# Patient Record
Sex: Male | Born: 1980 | Race: White | Hispanic: Yes | Marital: Single | Smoking: Current every day smoker
Health system: Southern US, Community
[De-identification: ages and names within clinical notes are randomized; demographics above are authoritative.]

---

## 2016-01-09 ENCOUNTER — Emergency Department: Payer: Self-pay

## 2016-01-09 ENCOUNTER — Emergency Department
Admission: EM | Admit: 2016-01-09 | Discharge: 2016-01-09 | Disposition: A | Discharge status: Home / Self Care | Payer: Self-pay | Attending: Emergency Medicine | Admitting: Emergency Medicine

## 2016-01-09 ENCOUNTER — Other Ambulatory Visit: Payer: Self-pay

## 2016-01-09 DIAGNOSIS — R0789 Other chest pain: Secondary | ICD-10-CM | POA: Insufficient documentation

## 2016-01-09 DIAGNOSIS — Z87891 Personal history of nicotine dependence: Secondary | ICD-10-CM | POA: Insufficient documentation

## 2016-01-09 LAB — CBC
HCT: 42.7 % (ref 40.0–52.0)
Hemoglobin: 14.5 g/dL (ref 13.0–18.0)
MCH: 29.6 pg (ref 26.0–34.0)
MCHC: 33.9 g/dL (ref 32.0–36.0)
MCV: 87.4 fL (ref 80.0–100.0)
Platelets: 231 10*3/uL (ref 150–440)
RBC: 4.89 MIL/uL (ref 4.40–5.90)
RDW: 13.6 % (ref 11.5–14.5)
WBC: 5.8 10*3/uL (ref 3.8–10.6)

## 2016-01-09 LAB — BASIC METABOLIC PANEL
Anion gap: 4 — ABNORMAL LOW (ref 5–15)
BUN: 26 mg/dL — AB (ref 6–20)
CALCIUM: 9.1 mg/dL (ref 8.9–10.3)
CO2: 27 mmol/L (ref 22–32)
CREATININE: 0.83 mg/dL (ref 0.61–1.24)
Chloride: 108 mmol/L (ref 101–111)
GFR calc Af Amer: >60 mL/min (ref >60)
GLUCOSE: 93 mg/dL (ref 65–99)
Potassium: 4 mmol/L (ref 3.5–5.1)
Sodium: 139 mmol/L (ref 135–145)

## 2016-01-09 LAB — TROPONIN I

## 2016-01-09 MED ORDER — IBUPROFEN 600 MG PO TABS: 600.0000 mg | ORAL_TABLET | Freq: Three times a day (TID) | ORAL | Status: AC | PRN

## 2016-01-09 NOTE — ED Provider Notes (Signed)
[facility] Emergency Department Provider Note    ____________________________________________  Time seen: ~1345  I have reviewed the triage vital signs and the nursing notes.   HISTORY  Chief Complaint Chest Pain   History limited by: Not Limited   HPI Tom Lawrence is a 35 y.o. male with no significant past medical history presents to the emergency department today because of concerns for left chest pain. Patient states the pain has been present for the past 2-3 weeks. He describes it as being located in his left front chest. There is also some pain in his left upper back. It is worse when he moves his arm or bends over. He denies any significant associated shortness breath. No fevers. No trauma. Denies similar symptoms in the past. Has tried Tylenol without great relief.   History reviewed. No pertinent past medical history.  There are no active problems to display for this patient.   History reviewed. No pertinent past surgical history.   Allergies Review of patient's allergies indicates no known allergies.  No family history on file.  Social History Social History  Substance Use Topics  . Smoking status: Former Games developermoker  . Smokeless tobacco: None  . Alcohol Use: Yes    Review of Systems  Constitutional: Negative for fever. Cardiovascular: Positive for left sided chest pain. Respiratory: Negative for shortness of breath. Gastrointestinal: Negative for abdominal pain, vomiting and diarrhea. Neurological: Negative for headaches, focal weakness or numbness.  10-point ROS otherwise negative.  ____________________________________________   PHYSICAL EXAM:  VITAL SIGNS: ED Triage Vitals  Enc Vitals Group     BP 01/09/16 1215 114/61 mmHg     Pulse Rate 01/09/16 1215 48     Resp 01/09/16 1215 20     Temp 01/09/16 1215 97.9 F (36.6 C)     Temp Source 01/09/16 1215 Oral     SpO2 01/09/16 1215 100 %     Weight 01/09/16  1215 154 lb 5.2 oz (70 kg)     Height 01/09/16 1215 5\' 5"  (1.651 m)     Head Cir --      Peak Flow --      Pain Score 01/09/16 1220 7   Constitutional: Alert and oriented. Well appearing and in no distress. Eyes: Conjunctivae are normal. PERRL. Normal extraocular movements. ENT   Head: Normocephalic and atraumatic.   Nose: No congestion/rhinnorhea.   Mouth/Throat: Mucous membranes are moist.   Neck: No stridor. Hematological/Lymphatic/Immunilogical: No cervical lymphadenopathy. Cardiovascular: Normal rate, regular rhythm.  No murmurs, rubs, or gallops. Left chest wall tender to palpation. Respiratory: Normal respiratory effort without tachypnea nor retractions. Breath sounds are clear and equal bilaterally. No wheezes/rales/rhonchi. Gastrointestinal: Soft and nontender. No distention.  Genitourinary: Deferred Musculoskeletal: Normal range of motion in all extremities. No joint effusions.  No lower extremity tenderness nor edema. Neurologic:  Normal speech and language. No gross focal neurologic deficits are appreciated.  Skin:  Skin is warm, dry and intact. No rash noted. Psychiatric: Mood and affect are normal. Speech and behavior are normal. Patient exhibits appropriate insight and judgment.  ____________________________________________    LABS (pertinent positives/negatives)  Labs Reviewed  BASIC METABOLIC PANEL - Abnormal; Notable for the following:    BUN 26 (*)    Anion gap 4 (*)    All other components within normal limits  CBC  TROPONIN I     ____________________________________________   EKG  I, Phineas SemenGraydon Jabri Blancett, attending physician, personally viewed and interpreted this EKG  EKG Time: 1232  Rate: 59 Rhythm: sinus bradycardia Axis: normal Intervals: qtc 405 QRS: RSR' in V1, V2 ST changes: no st elevation Impression: abnormal ekg   ____________________________________________    RADIOLOGY  CXR IMPRESSION: No active cardiopulmonary  disease.  ____________________________________________   PROCEDURES  Procedure(s) performed: None  Critical Care performed: No  ____________________________________________   INITIAL IMPRESSION / ASSESSMENT AND PLAN / ED COURSE  Pertinent labs & imaging results that were available during my care of the patient were reviewed by me and considered in my medical decision making (see chart for details).  Patient presented to the emergency department today because of concerns for left chest pain. Blood work EKG and chest x-ray without concerning findings. Patient was tender to palpation of the left chest on exam. 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 This point I do think patient likely suffering from musculoskeletal skeletal pain. Will give high-dose ibuprofen.  ____________________________________________   FINAL CLINICAL IMPRESSION(S) / ED DIAGNOSES  Final diagnoses:  Chest wall pain     Phineas Semen, MD 01/09/16 1430

## 2016-01-09 NOTE — Discharge Instructions (Signed)
Please seek medical attention for any high fevers, chest pain, shortness of breath, change in behavior, persistent vomiting, bloody stool or any other new or concerning symptoms.   Dolor en la pared torcica (Chest Wall Pain) El dolor en la pared torcica se produce en los huesos y los msculos del pecho o alrededor de Scientist, product/process developmentestos. A veces, una lesin Occupational psychologistcausa este dolor. En ocasiones, la causa puede ser desconocida. Este dolor puede durar varias semanas. CUIDADOS EN EL HOGAR Est atento a cualquier cambio en los sntomas. Tome estas medidas para Acupuncturistaliviar el dolor:  Sports administratorHaga reposo tal como le indic el mdico.  Evite las actividades que causan dolor. Intente no usar los Km 47-7msculos del trax, los del vientre (abdominales) o los laterales para levantar objetos pesados.  Si se lo indican, aplique hielo sobre la zona dolorida:  Ponga el hielo en una bolsa plstica.  Coloque una toalla entre la piel y la bolsa de hielo.  Coloque el hielo durante 20minutos, 2 a 3veces por Futures traderda.  Tome los medicamentos de venta libre y los recetados solamente como se lo haya indicado el mdico.  No consuma productos que contengan tabaco, incluidos cigarrillos, tabaco de Theatre managermascar y Administrator, Civil Servicecigarrillos electrnicos. Si necesita ayuda para dejar de fumar, consulte al mdico.  Concurra a todas las visitas de control como se lo haya indicado el mdico. Esto es importante. SOLICITE AYUDA SI:  Tiene fiebre.  El dolor en el pecho Brantleyempeora.  Aparecen nuevos sntomas. SOLICITE AYUDA DE INMEDIATO SI:  Tiene malestar estomacal (nuseas) o vomita.  Berenice Primasranspira o tiene sensacin de desvanecimiento.  Tiene tos con flema (esputo) o expectora sangre al toser.  Comienza a sentir falta de aire.   Esta informacin no tiene Theme park managercomo fin reemplazar el consejo del mdico. Asegrese de hacerle al mdico cualquier pregunta que tenga.   Document Released: 09/08/2011 Document Revised: 06/10/2015 Elsevier Interactive Patient Education [employer]2016 Elsevier  Inc.

## 2016-01-09 NOTE — ED Notes (Signed)
Pt reports pain in back and chest started about 3 weeks ago. Denies injury. Pain increased with lying down. Reports some SOB denies nausea.

## 2017-05-28 ENCOUNTER — Emergency Department: Payer: Self-pay

## 2017-05-28 ENCOUNTER — Encounter: Payer: Self-pay | Admitting: Emergency Medicine

## 2017-05-28 ENCOUNTER — Emergency Department
Admission: EM | Admit: 2017-05-28 | Discharge: 2017-05-28 | Disposition: A | Discharge status: Home / Self Care | Payer: Self-pay | Attending: Emergency Medicine | Admitting: Emergency Medicine

## 2017-05-28 DIAGNOSIS — Y9389 Activity, other specified: Secondary | ICD-10-CM | POA: Insufficient documentation

## 2017-05-28 DIAGNOSIS — Y929 Unspecified place or not applicable: Secondary | ICD-10-CM | POA: Insufficient documentation

## 2017-05-28 DIAGNOSIS — Y999 Unspecified external cause status: Secondary | ICD-10-CM | POA: Insufficient documentation

## 2017-05-28 DIAGNOSIS — Z23 Encounter for immunization: Secondary | ICD-10-CM | POA: Insufficient documentation

## 2017-05-28 DIAGNOSIS — W3400XA Accidental discharge from unspecified firearms or gun, initial encounter: Secondary | ICD-10-CM | POA: Insufficient documentation

## 2017-05-28 DIAGNOSIS — S81801A Unspecified open wound, right lower leg, initial encounter: Secondary | ICD-10-CM | POA: Insufficient documentation

## 2017-05-28 DIAGNOSIS — F17200 Nicotine dependence, unspecified, uncomplicated: Secondary | ICD-10-CM | POA: Insufficient documentation

## 2017-05-28 MED ORDER — CEPHALEXIN 500 MG PO CAPS: 500.0000 mg | ORAL_CAPSULE | Freq: Four times a day (QID) | ORAL | 0 refills | Status: AC

## 2017-05-28 MED ORDER — OXYCODONE-ACETAMINOPHEN 5-325 MG PO TABS: 1.0000 | ORAL_TABLET | Freq: Four times a day (QID) | ORAL | 0 refills | Status: AC | PRN

## 2017-05-28 MED ORDER — ACETAMINOPHEN 500 MG PO TABS
1000.0000 mg | ORAL_TABLET | Freq: Once | ORAL | Status: AC
  Administered 2017-05-28: 1000 mg via ORAL
  Filled 2017-05-28: qty 2

## 2017-05-28 MED ORDER — TETANUS-DIPHTH-ACELL PERTUSSIS 5-2.5-18.5 LF-MCG/0.5 IM SUSP
0.5000 mL | Freq: Once | INTRAMUSCULAR | Status: AC
  Administered 2017-05-28: 0.5 mL via INTRAMUSCULAR
  Filled 2017-05-28: qty 0.5

## 2017-05-28 MED ORDER — CEFAZOLIN SODIUM 1 G IJ SOLR
1.0000 g | Freq: Once | INTRAMUSCULAR | Status: AC
  Administered 2017-05-28: 1 g via INTRAMUSCULAR
  Filled 2017-05-28: qty 10

## 2017-05-28 MED ORDER — OXYCODONE HCL 5 MG PO TABS
5.0000 mg | ORAL_TABLET | Freq: Once | ORAL | Status: AC
  Administered 2017-05-28: 5 mg via ORAL
  Filled 2017-05-28: qty 1
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
## 2017-05-28 NOTE — ED Notes (Signed)
X-ray at bedside

## 2017-05-28 NOTE — Discharge Instructions (Signed)
Por favor tome los antibiticos segn lo prescrito. Tome analgsicos segn sea necesario. Regrese a la sala de emergencias si nota que sale pus de la herida, si tiene enrojecimiento de la piel, empeoramiento del Engineer, mining, entumecimiento de la pierna o el pie, o si tiene [place]. Tambin regrese a la sala de emergencias si nota cambios en el color de su pie y dedo del pie en el lado derecho, incluso azul o plido.   Please take the antibiotics as prescribed. Take pain medication as needed. Return to the emergency room if you notice pus coming from your wound, if you have redness of the skin, worsening pain, numbness of the leg or foot, or if you have a fever. Also return to the emergency room if you notice changes in color of your foot and toe on the right side including blue or pale.
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
